# Patient Record
Sex: Female | Born: 1955 | Hispanic: No | Marital: Married | Smoking: Never smoker
Health system: Southern US, Community
[De-identification: ages and names within clinical notes are randomized; demographics above are authoritative.]

---

## 2002-04-22 ENCOUNTER — Other Ambulatory Visit: Admission: RE | Admit: 2002-04-22 | Discharge: 2002-04-22 | Payer: Self-pay | Admitting: Obstetrics and Gynecology

## 2004-05-21 ENCOUNTER — Other Ambulatory Visit: Admission: RE | Admit: 2004-05-21 | Discharge: 2004-05-21 | Payer: Self-pay | Admitting: Obstetrics and Gynecology

## 2005-06-01 ENCOUNTER — Other Ambulatory Visit: Admission: RE | Admit: 2005-06-01 | Discharge: 2005-06-01 | Payer: Self-pay | Admitting: Obstetrics and Gynecology

## 2009-02-06 ENCOUNTER — Ambulatory Visit: Payer: Self-pay | Admitting: Diagnostic Radiology

## 2009-02-06 ENCOUNTER — Ambulatory Visit (HOSPITAL_BASED_OUTPATIENT_CLINIC_OR_DEPARTMENT_OTHER): Admission: RE | Admit: 2009-02-06 | Discharge: 2009-02-06 | Payer: Self-pay | Admitting: Family Medicine

## 2011-02-09 ENCOUNTER — Other Ambulatory Visit: Payer: Self-pay | Admitting: Obstetrics and Gynecology

## 2011-02-09 DIAGNOSIS — R928 Other abnormal and inconclusive findings on diagnostic imaging of breast: Secondary | ICD-10-CM

## 2011-02-24 ENCOUNTER — Ambulatory Visit
Admission: RE | Admit: 2011-02-24 | Discharge: 2011-02-24 | Disposition: A | Discharge status: Home / Self Care | Payer: No Typology Code available for payment source | Source: Ambulatory Visit | Attending: Obstetrics and Gynecology | Admitting: Obstetrics and Gynecology

## 2011-02-24 DIAGNOSIS — R928 Other abnormal and inconclusive findings on diagnostic imaging of breast: Secondary | ICD-10-CM

## 2011-10-05 ENCOUNTER — Other Ambulatory Visit: Payer: Self-pay | Admitting: Obstetrics and Gynecology

## 2011-10-05 DIAGNOSIS — Z780 Asymptomatic menopausal state: Secondary | ICD-10-CM

## 2011-10-18 ENCOUNTER — Encounter: Payer: Self-pay | Admitting: Gastroenterology

## 2011-11-04 ENCOUNTER — Encounter: Payer: Self-pay | Admitting: Internal Medicine

## 2011-11-24 ENCOUNTER — Encounter: Payer: No Typology Code available for payment source | Admitting: Gastroenterology

## 2011-12-09 ENCOUNTER — Ambulatory Visit (AMBULATORY_SURGERY_CENTER): Payer: No Typology Code available for payment source | Admitting: *Deleted

## 2011-12-09 VITALS — Ht 60.0 in | Wt 123.0 lb

## 2011-12-09 DIAGNOSIS — Z1211 Encounter for screening for malignant neoplasm of colon: Secondary | ICD-10-CM

## 2011-12-09 MED ORDER — NA SULFATE-K SULFATE-MG SULF 17.5-3.13-1.6 GM/177ML PO SOLN: ORAL | Status: DC

## 2011-12-23 ENCOUNTER — Ambulatory Visit (AMBULATORY_SURGERY_CENTER): Payer: No Typology Code available for payment source | Admitting: Gastroenterology

## 2011-12-23 VITALS — BP 126/62 | HR 53 | Temp 96.9°F | Resp 12 | Ht 60.0 in | Wt 123.0 lb

## 2011-12-23 DIAGNOSIS — Z1211 Encounter for screening for malignant neoplasm of colon: Secondary | ICD-10-CM

## 2011-12-23 DIAGNOSIS — K648 Other hemorrhoids: Secondary | ICD-10-CM

## 2011-12-23 MED ORDER — SODIUM CHLORIDE 0.9 % IV SOLN: 500.0000 mL | INTRAVENOUS | Status: DC

## 2011-12-23 NOTE — Patient Instructions (Addendum)
Discharge instructions given with verbal understanding. Handouts on hemorrhoids and a high fiber diet given. Resume previous medications. YOU HAD AN ENDOSCOPIC PROCEDURE TODAY AT THE [facility]: Refer to the procedure report that was given to you for any specific questions about what was found during the examination.  If the procedure report does not answer your questions, please call your gastroenterologist to clarify.  If you requested that your care partner not be given the details of your procedure findings, then the procedure report has been included in a sealed envelope for you to review at your convenience later.  YOU SHOULD EXPECT: Some feelings of bloating in the abdomen. Passage of more gas than usual.  Walking can help get rid of the air that was put into your GI tract during the procedure and reduce the bloating. If you had a lower endoscopy (such as a colonoscopy or flexible sigmoidoscopy) you may notice spotting of blood in your stool or on the toilet paper. If you underwent a bowel prep for your procedure, then you may not have a normal bowel movement for a few days.  DIET: Your first meal following the procedure should be a light meal and then it is ok to progress to your normal diet.  A half-sandwich or bowl of soup is an example of a good first meal.  Heavy or fried foods are harder to digest and may make you feel nauseous or bloated.  Likewise meals heavy in dairy and vegetables can cause extra gas to form and this can also increase the bloating.  Drink plenty of fluids but you should avoid alcoholic beverages for 24 hours.  ACTIVITY: Your care partner should take you home directly after the procedure.  You should plan to take it easy, moving slowly for the rest of the day.  You can resume normal activity the day after the procedure however you should NOT DRIVE or use heavy machinery for 24 hours (because of the sedation medicines used during the test).    SYMPTOMS TO  REPORT IMMEDIATELY: A gastroenterologist can be reached at any hour.  During normal business hours, 8:30 AM to 5:00 PM Monday through Friday, call [phone].  After hours and on weekends, please call the GI answering service at [phone] who will take a message and have the physician on call contact you.   Following lower endoscopy (colonoscopy or flexible sigmoidoscopy):  Excessive amounts of blood in the stool  Significant tenderness or worsening of abdominal pains  Swelling of the abdomen that is new, acute  Fever of 100F or higher FOLLOW UP: If any biopsies were taken you will be contacted by phone or by letter within the next 1-3 weeks.  Call your gastroenterologist if you have not heard about the biopsies in 3 weeks.  Our staff will call the home number listed on your records the next business day following your procedure to check on you and address any questions or concerns that you may have at that time regarding the information given to you following your procedure. This is a courtesy call and so if there is no answer at the home number and we have not heard from you through the emergency physician on call, we will assume that you have returned to your regular daily activities without incident.  SIGNATURES/CONFIDENTIALITY: You and/or your care partner have signed paperwork which will be entered into your electronic medical record.  These signatures attest to the fact that that the information above on your   After Visit Summary has been reviewed and is understood.  Full responsibility of the confidentiality of this discharge information lies with you and/or your care-partner.  

## 2011-12-23 NOTE — Op Note (Addendum)
[facility] [address] [place], [address]   COLONOSCOPY PROCEDURE REPORT  PATIENT: Vanessa Chan, Vanessa Chan  MR#: [id number] BIRTHDATE: Jan 27, 1956 , 56  yrs. old GENDER: Female ENDOSCOPIST: Louis Meckel, MD REFERRED XB:JYNWGN Foy [place], M.D. PROCEDURE DATE:  12/23/2011 PROCEDURE:   Colonoscopy, diagnostic ASA CLASS:   Class I INDICATIONS:average risk screening. MEDICATIONS: MAC sedation, administered by CRNA, Propofol (Diprivan) 130 mg IV, and Atropine 1 mg IV  DESCRIPTION OF PROCEDURE:   After the risks benefits and alternatives of the procedure were thoroughly explained, informed consent was obtained.  A digital rectal exam revealed a skin tag. The LB CF-[id number]  endoscope was introduced through the anus and advanced to the cecum, which was identified by both the appendix and ileocecal valve. No adverse events experienced.   The quality of the prep was Suprep excellent  The instrument was then slowly withdrawn as the colon was fully examined.      COLON FINDINGS: Small internal hemorrhoids were found.   The colon mucosa was otherwise normal.  Retroflexed views revealed no abnormalities. The time to cecum=6 minutes 08 seconds.  Withdrawal time=6 minutes 09 seconds.  The scope was withdrawn and the procedure completed. COMPLICATIONS: There were no complications.  ENDOSCOPIC IMPRESSION: 1.   Small internal hemorrhoids 2.   The colon mucosa was otherwise normal  RECOMMENDATIONS: Continue current colorectal screening recommendations for "routine risk" patients with a repeat colonoscopy in 10 years.   eSigned:  Louis Meckel, MD 12/23/2011 9:54 AM Revised: 12/23/2011 9:54 AM  cc: Conley Simmonds, MD

## 2011-12-23 NOTE — Progress Notes (Signed)
Patient did not experience any of the following events: a burn prior to discharge; a fall within the facility; wrong site/side/patient/procedure/implant event; or a hospital transfer or hospital admission upon discharge from the facility. (G8907) Patient did not have preoperative order for IV antibiotic SSI prophylaxis. (G8918)  

## 2011-12-27 ENCOUNTER — Telehealth: Payer: Self-pay

## 2011-12-27 NOTE — Telephone Encounter (Signed)
Unable to leave message number was a fax machine.

## 2014-05-14 ENCOUNTER — Other Ambulatory Visit: Payer: Self-pay | Admitting: Obstetrics and Gynecology

## 2014-05-14 DIAGNOSIS — Z780 Asymptomatic menopausal state: Secondary | ICD-10-CM

## 2014-05-26 ENCOUNTER — Other Ambulatory Visit: Payer: No Typology Code available for payment source

## 2014-05-28 ENCOUNTER — Ambulatory Visit
Admission: RE | Admit: 2014-05-28 | Discharge: 2014-05-28 | Disposition: A | Discharge status: Home / Self Care | Payer: Commercial Managed Care - PPO | Source: Ambulatory Visit | Attending: Obstetrics and Gynecology | Admitting: Obstetrics and Gynecology

## 2014-05-28 DIAGNOSIS — Z780 Asymptomatic menopausal state: Secondary | ICD-10-CM

## 2016-06-09 DIAGNOSIS — K120 Recurrent oral aphthae: Secondary | ICD-10-CM | POA: Diagnosis not present

## 2016-07-06 DIAGNOSIS — Z124 Encounter for screening for malignant neoplasm of cervix: Secondary | ICD-10-CM | POA: Diagnosis not present

## 2016-07-06 DIAGNOSIS — Z1231 Encounter for screening mammogram for malignant neoplasm of breast: Secondary | ICD-10-CM | POA: Diagnosis not present

## 2016-07-06 DIAGNOSIS — Z01419 Encounter for gynecological examination (general) (routine) without abnormal findings: Secondary | ICD-10-CM | POA: Diagnosis not present

## 2016-08-15 DIAGNOSIS — H04123 Dry eye syndrome of bilateral lacrimal glands: Secondary | ICD-10-CM | POA: Diagnosis not present

## 2016-08-15 DIAGNOSIS — H35372 Puckering of macula, left eye: Secondary | ICD-10-CM | POA: Diagnosis not present

## 2016-08-15 DIAGNOSIS — H2513 Age-related nuclear cataract, bilateral: Secondary | ICD-10-CM | POA: Diagnosis not present

## 2017-02-17 DIAGNOSIS — H35372 Puckering of macula, left eye: Secondary | ICD-10-CM | POA: Diagnosis not present

## 2017-02-24 DIAGNOSIS — Z23 Encounter for immunization: Secondary | ICD-10-CM | POA: Diagnosis not present

## 2017-07-12 DIAGNOSIS — Z01419 Encounter for gynecological examination (general) (routine) without abnormal findings: Secondary | ICD-10-CM | POA: Diagnosis not present

## 2017-07-12 DIAGNOSIS — Z124 Encounter for screening for malignant neoplasm of cervix: Secondary | ICD-10-CM | POA: Diagnosis not present

## 2017-07-12 DIAGNOSIS — Z1231 Encounter for screening mammogram for malignant neoplasm of breast: Secondary | ICD-10-CM | POA: Diagnosis not present

## 2021-02-09 ENCOUNTER — Other Ambulatory Visit: Payer: Self-pay | Admitting: Obstetrics

## 2021-02-09 DIAGNOSIS — R928 Other abnormal and inconclusive findings on diagnostic imaging of breast: Secondary | ICD-10-CM

## 2021-02-26 ENCOUNTER — Ambulatory Visit: Payer: Commercial Managed Care - PPO

## 2021-02-26 ENCOUNTER — Ambulatory Visit
Admission: RE | Admit: 2021-02-26 | Discharge: 2021-02-26 | Disposition: A | Discharge status: Home / Self Care | Payer: Managed Care, Other (non HMO) | Source: Ambulatory Visit | Attending: Obstetrics | Admitting: Obstetrics

## 2021-02-26 ENCOUNTER — Other Ambulatory Visit: Payer: Self-pay

## 2021-02-26 DIAGNOSIS — R928 Other abnormal and inconclusive findings on diagnostic imaging of breast: Secondary | ICD-10-CM

## 2021-09-14 ENCOUNTER — Other Ambulatory Visit: Payer: Self-pay | Admitting: Family Medicine

## 2021-09-14 DIAGNOSIS — M8580 Other specified disorders of bone density and structure, unspecified site: Secondary | ICD-10-CM

## 2022-02-10 ENCOUNTER — Encounter: Payer: Self-pay | Admitting: Internal Medicine

## 2022-02-23 ENCOUNTER — Encounter: Payer: Self-pay | Admitting: Internal Medicine

## 2022-03-11 ENCOUNTER — Ambulatory Visit (AMBULATORY_SURGERY_CENTER): Payer: Self-pay

## 2022-03-11 VITALS — Ht 60.0 in | Wt 120.0 lb

## 2022-03-11 DIAGNOSIS — Z1211 Encounter for screening for malignant neoplasm of colon: Secondary | ICD-10-CM

## 2022-03-11 MED ORDER — NA SULFATE-K SULFATE-MG SULF 17.5-3.13-1.6 GM/177ML PO SOLN: 1.0000 | Freq: Once | ORAL | 0 refills | Status: AC

## 2022-03-11 NOTE — Progress Notes (Signed)
No egg or soy allergy known to patient  No issues known to pt with past sedation with any surgeries or procedures Patient denies ever being told they had issues or difficulty with intubation  No FH of Malignant Hyperthermia Pt is not on diet pills Pt is not on  home 02  Pt is not on blood thinners  Pt denies issues with constipation  No A fib or A flutter Have any cardiac testing pending--no Pt instructed to use [employer] or GoodRx for a price reduction on prep   Due to language barrier, an interpreter was present during the history-taking and subsequent discussion (and for part of the physical exam) with this patient. Family member was interpreter and Release was signed.  Patient's chart reviewed by Cathlyn Parsons CNRA prior to previsit and patient appropriate for the [facility].  Previsit completed and red dot placed by patient's name on their procedure day (on provider's schedule).

## 2022-04-05 ENCOUNTER — Encounter: Payer: Self-pay | Admitting: Internal Medicine

## 2022-04-11 ENCOUNTER — Ambulatory Visit: Payer: Managed Care, Other (non HMO) | Admitting: Internal Medicine

## 2022-04-11 ENCOUNTER — Encounter: Payer: Self-pay | Admitting: Internal Medicine

## 2022-04-11 VITALS — BP 122/55 | HR 51 | Temp 97.5°F | Resp 16 | Ht 60.0 in | Wt 111.6 lb

## 2022-04-11 DIAGNOSIS — Z1211 Encounter for screening for malignant neoplasm of colon: Secondary | ICD-10-CM | POA: Diagnosis not present

## 2022-04-11 MED ORDER — SODIUM CHLORIDE 0.9 % IV SOLN: 500.0000 mL | Freq: Once | INTRAVENOUS | Status: DC

## 2022-04-11 NOTE — Patient Instructions (Signed)
-  Discharge patient to home (with escort). - Repeat colonoscopy in 10 years for screening purposes. -Handout on hemorrhoids provided   YOU HAD AN ENDOSCOPIC PROCEDURE TODAY AT THE [facility]:   Refer to the procedure report that was given to you for any specific questions about what was found during the examination.  If the procedure report does not answer your questions, please call your gastroenterologist to clarify.  If you requested that your care partner not be given the details of your procedure findings, then the procedure report has been included in a sealed envelope for you to review at your convenience later.  YOU SHOULD EXPECT: Some feelings of bloating in the abdomen. Passage of more gas than usual.  Walking can help get rid of the air that was put into your GI tract during the procedure and reduce the bloating. If you had a lower endoscopy (such as a colonoscopy or flexible sigmoidoscopy) you may notice spotting of blood in your stool or on the toilet paper. If you underwent a bowel prep for your procedure, you may not have a normal bowel movement for a few days.  Please Note:  You might notice some irritation and congestion in your nose or some drainage.  This is from the oxygen used during your procedure.  There is no need for concern and it should clear up in a day or so.  SYMPTOMS TO REPORT IMMEDIATELY:  Following lower endoscopy (colonoscopy or flexible sigmoidoscopy):  Excessive amounts of blood in the stool  Significant tenderness or worsening of abdominal pains  Swelling of the abdomen that is new, acute  Fever of 100F or higher  For urgent or emergent issues, a gastroenterologist can be reached at any hour by calling [phone]. Do not use MyChart messaging for urgent concerns.    DIET:  We do recommend a small meal at first, but then you may proceed to your regular diet.  Drink plenty of fluids but you should avoid alcoholic beverages for 24  hours.  ACTIVITY:  You should plan to take it easy for the rest of today and you should NOT DRIVE or use heavy machinery until tomorrow (because of the sedation medicines used during the test).    FOLLOW UP: Our staff will call the number listed on your records the next business day following your procedure.  We will call around 7:15- 8:00 am to check on you and address any questions or concerns that you may have regarding the information given to you following your procedure. If we do not reach you, we will leave a message.     If any biopsies were taken you will be contacted by phone or by letter within the next 1-3 weeks.  Please call us at [phone] if you have not heard about the biopsies in 3 weeks.    SIGNATURES/CONFIDENTIALITY: You and/or your care partner have signed paperwork which will be entered into your electronic medical record.  These signatures attest to the fact that that the information above on your After Visit Summary has been reviewed and is understood.  Full responsibility of the confidentiality of this discharge information lies with you and/or your care-partner.

## 2022-04-11 NOTE — Progress Notes (Signed)
Vss nad trans to pacu °

## 2022-04-11 NOTE — Progress Notes (Signed)
GASTROENTEROLOGY PROCEDURE H&P NOTE   Primary Care Physician: Marinda Elk, MD    Reason for Procedure:   Colon cancer screening  Plan:    Colonoscopy  Patient is appropriate for endoscopic procedure(s) in the ambulatory ([facility]) setting.  The nature of the procedure, as well as the risks, benefits, and alternatives were carefully and thoroughly reviewed with the patient. Ample time for discussion and questions allowed. The patient understood, was satisfied, and agreed to proceed.     HPI: Vanessa Chan is a 66 y.o. female who presents for colonoscopy for colon cancer screening. Denies blood in stools or unintentional weight loss. Denies family history of colon cancer. Last colonoscopy was 10 years ago that was normal.  History reviewed. No pertinent past medical history.  Past Surgical History:  Procedure Laterality Date   CESAREAN SECTION  1987/1997    Prior to Admission medications   Medication Sig Start Date End Date Taking? Authorizing Provider  Astaxanthin 5 MG CAPS Take by mouth.   Yes [provider]  cholecalciferol (VITAMIN D3) 25 MCG (1000 UNIT) tablet Take 1,000 Units by mouth daily.   Yes [provider]  fish oil-omega-3 fatty acids 1000 MG capsule Take 1 g by mouth daily.   Yes [provider]    Current Outpatient Medications  Medication Sig Dispense Refill   Astaxanthin 5 MG CAPS Take by mouth.     cholecalciferol (VITAMIN D3) 25 MCG (1000 UNIT) tablet Take 1,000 Units by mouth daily.     fish oil-omega-3 fatty acids 1000 MG capsule Take 1 g by mouth daily.     Current Facility-Administered Medications  Medication Dose Route Frequency Provider Last Rate Last Admin   0.9 %  sodium chloride infusion  500 mL Intravenous Once Imogene Burn, MD        Allergies as of 04/11/2022   (No Known Allergies)    Family History  Problem Relation Age of Onset   Breast cancer Sister    Colon cancer Neg Hx    Colon polyps Neg Hx     Esophageal cancer Neg Hx    Rectal cancer Neg Hx    Stomach cancer Neg Hx     Social History   Socioeconomic History   Marital status: Married    Spouse name: Not on file   Number of children: Not on file   Years of education: Not on file   Highest education level: Not on file  Occupational History   Not on file  Tobacco Use   Smoking status: Never   Smokeless tobacco: Never  Vaping Use   Vaping Use: Never used  Substance and Sexual Activity   Alcohol use: No   Drug use: No   Sexual activity: Not on file  Other Topics Concern   Not on file  Social History Narrative   Not on file   Social Determinants of Health   Financial Resource Strain: Not on file  Food Insecurity: Not on file  Transportation Needs: Not on file  Physical Activity: Not on file  Stress: Not on file  Social Connections: Not on file  Intimate Partner Violence: Not on file    Physical Exam: Vital signs in last 24 hours: BP 109/73   Pulse 60   Temp (!) 97.5 F (36.4 C)   Ht 5' (1.524 m)   Wt 111 lb 9.6 oz (50.6 kg)   SpO2 98%   BMI 21.80 kg/m  GEN: NAD EYE: Sclerae anicteric ENT: MMM  CV: Non-tachycardic Pulm: No increased work of breathing GI: Soft, NT/ND NEURO:  Alert & Oriented   Vanessa Pont, MD [facility]  04/11/2022 10:51 AM

## 2022-04-11 NOTE — Op Note (Addendum)
[facility] Patient Name: Vanessa Chan Procedure Date: 04/11/2022 11:22 AM MRN: [id number] Endoscopist: Georgian Co , , [id number] Age: 66 Referring MD:  Date of Birth: Sep 22, 1955 Gender: Female Account #: [id number] Procedure:                Colonoscopy Indications:              Screening for colorectal malignant neoplasm Medicines:                Monitored Anesthesia Care Procedure:                Pre-Anesthesia Assessment:                           - Prior to the procedure, a History and Physical                            was performed, and patient medications and                            allergies were reviewed. The patient's tolerance of                            previous anesthesia was also reviewed. The risks                            and benefits of the procedure and the sedation                            options and risks were discussed with the patient.                            All questions were answered, and informed consent                            was obtained. Prior Anticoagulants: The patient has                            taken no anticoagulant or antiplatelet agents. ASA                            Grade Assessment: I - A normal, healthy patient.                            After reviewing the risks and benefits, the patient                            was deemed in satisfactory condition to undergo the                            procedure.                           After obtaining informed consent, the colonoscope  was passed under direct vision. Throughout the                            procedure, the patient's blood pressure, pulse, and                            oxygen saturations were monitored continuously. The                            Olympus [id number]-[id number] (#[id number]) Colonoscope was                            introduced through the anus and advanced to the the                            terminal ileum. The  colonoscopy was performed                            without difficulty. The patient tolerated the                            procedure well. The quality of the bowel                            preparation was good. The terminal ileum, ileocecal                            valve, appendiceal orifice, and rectum were                            photographed. Scope In: 11:34:24 AM Scope Out: 11:47:49 AM Scope Withdrawal Time: 0 hours 10 minutes 24 seconds  Total Procedure Duration: 0 hours 13 minutes 25 seconds  Findings:                 The terminal ileum appeared normal.                           Non-bleeding internal hemorrhoids were found during                            retroflexion.                           The exam was otherwise without abnormality. Complications:            No immediate complications. Estimated Blood Loss:     Estimated blood loss: none. Impression:               - The examined portion of the ileum was normal.                           - Non-bleeding internal hemorrhoids.                           - The examination was otherwise normal.                           -  No specimens collected. Recommendation:           - Discharge patient to home (with escort).                           - Repeat colonoscopy in 10 years for screening                            purposes.                           - The findings and recommendations were discussed                            with the patient. Dr Georgian Co "Lyndee Leo" Lorenso Courier,  04/11/2022 11:51:18 AM

## 2022-04-11 NOTE — Progress Notes (Signed)
VS by DT  Pt's states no medical or surgical changes since previsit or office visit.  

## 2022-04-12 ENCOUNTER — Telehealth: Payer: Self-pay | Admitting: *Deleted

## 2022-04-12 NOTE — Telephone Encounter (Signed)
No answer on  follow up call. Left message.   

## 2022-12-24 IMAGING — MG MM DIGITAL DIAGNOSTIC UNILAT*L* W/ TOMO W/ CAD
8 series · 9 of 24 positions shown · non-contrast
Comparison: Previous exam(s).

CLINICAL DATA: Patient recalled from screening for left breast
possible mass and distortion.

EXAM:
DIGITAL DIAGNOSTIC UNILATERAL LEFT MAMMOGRAM WITH TOMOSYNTHESIS AND
CAD
TECHNIQUE: Left digital diagnostic mammography and breast tomosynthesis was
performed. The images were evaluated with computer-aided detection.

[L ML synth-2D]
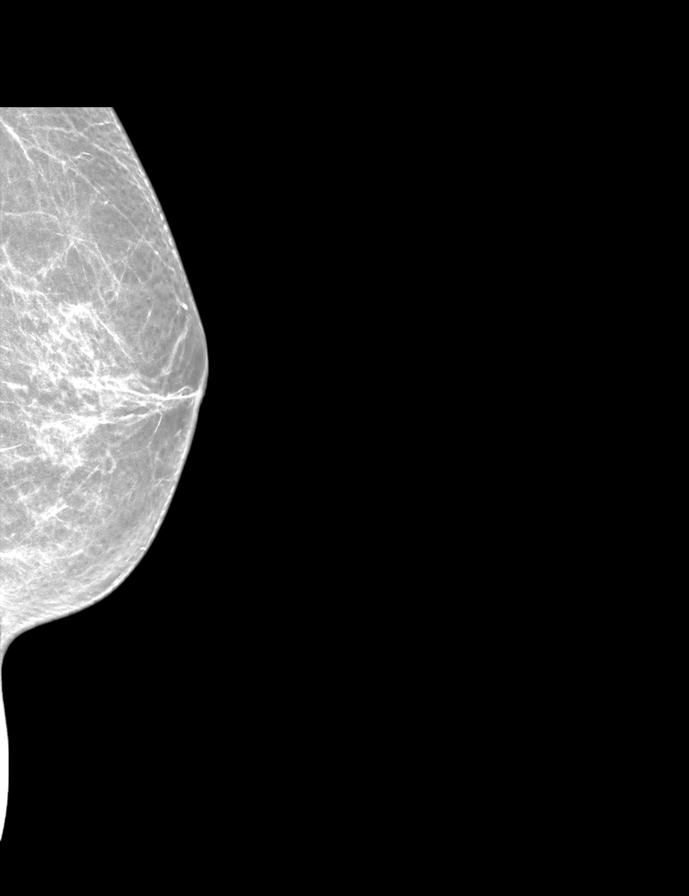

[L CC synth-2D (1 of 3)]
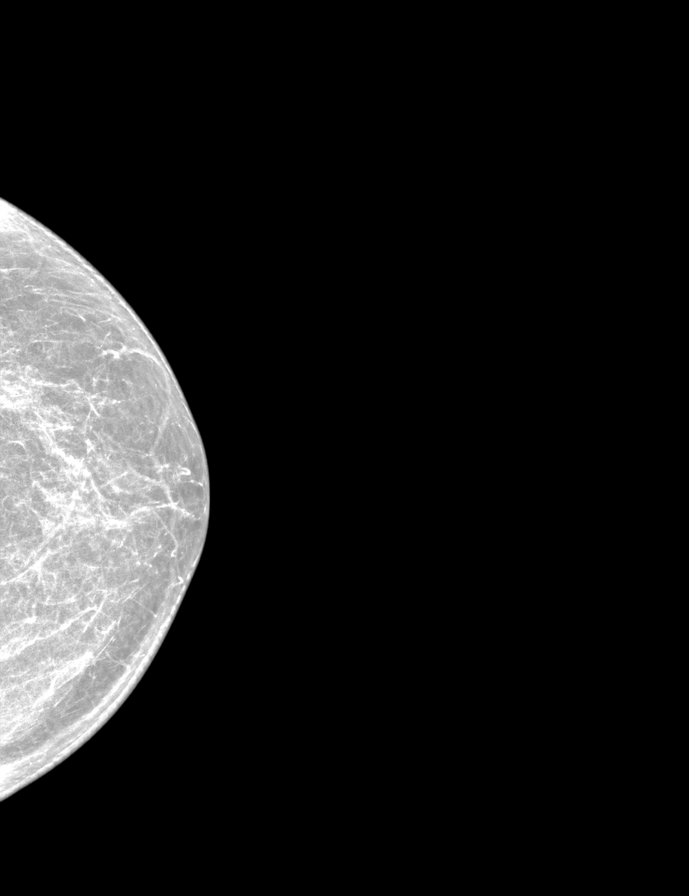

[L CC synth-2D (2 of 3)]
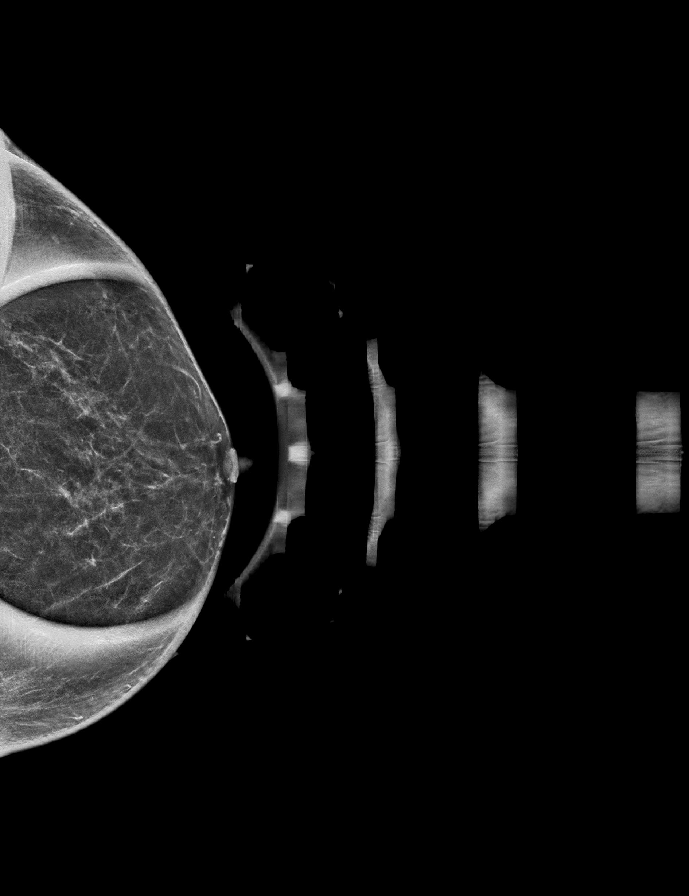

[L CC synth-2D (3 of 3)]
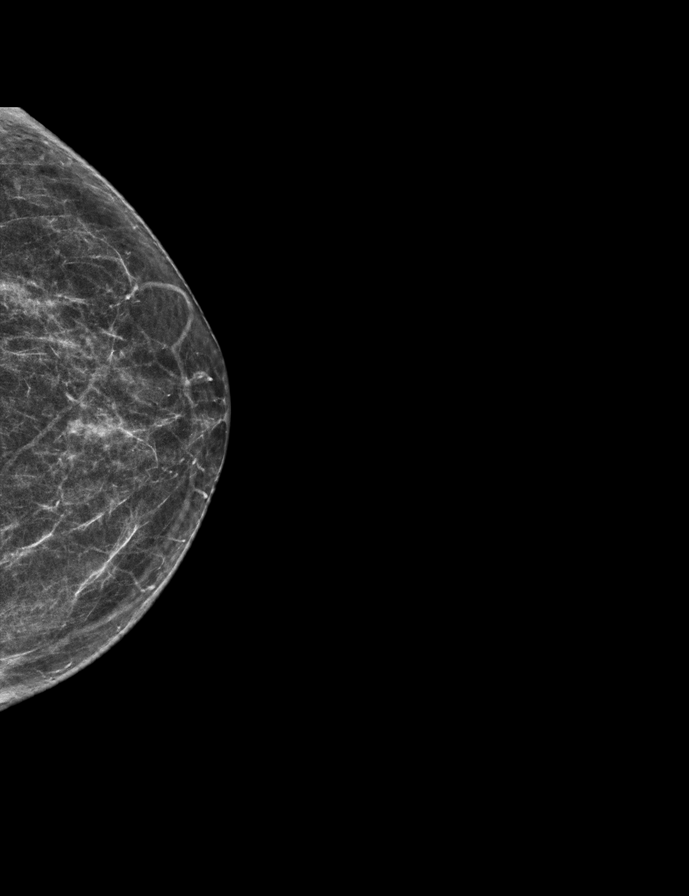

[L CC tomo · 2 of 54 frames shown (1 of 3)]
[frame 18/54]
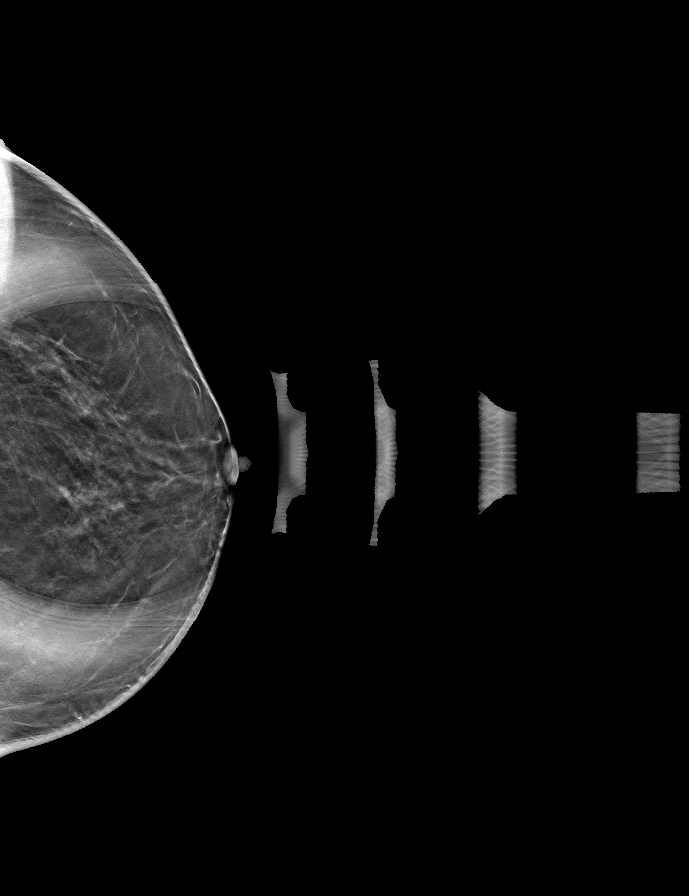
[frame 27/54]
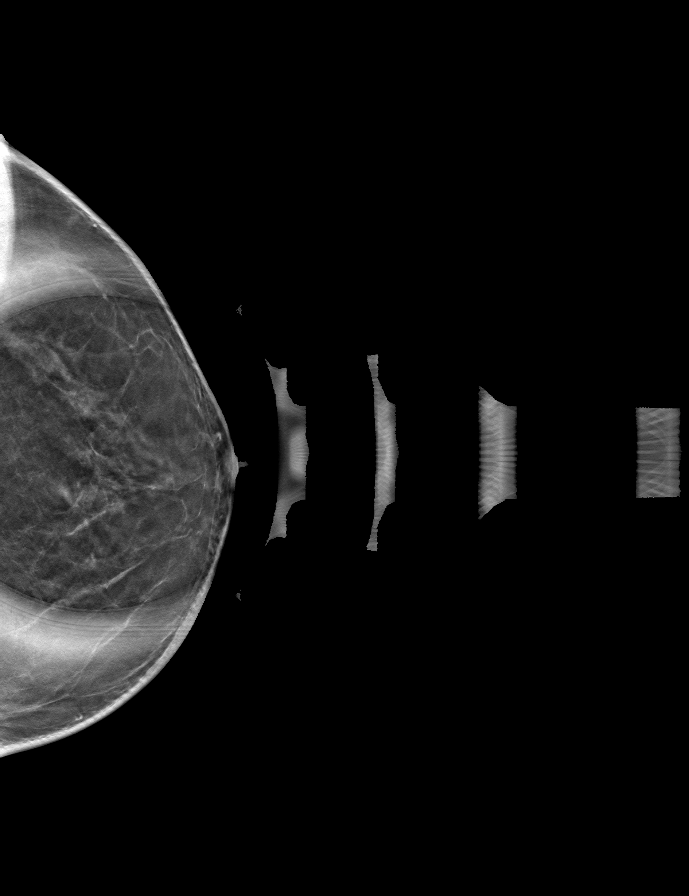

[L ML tomo · tomo slice 27/54.0]
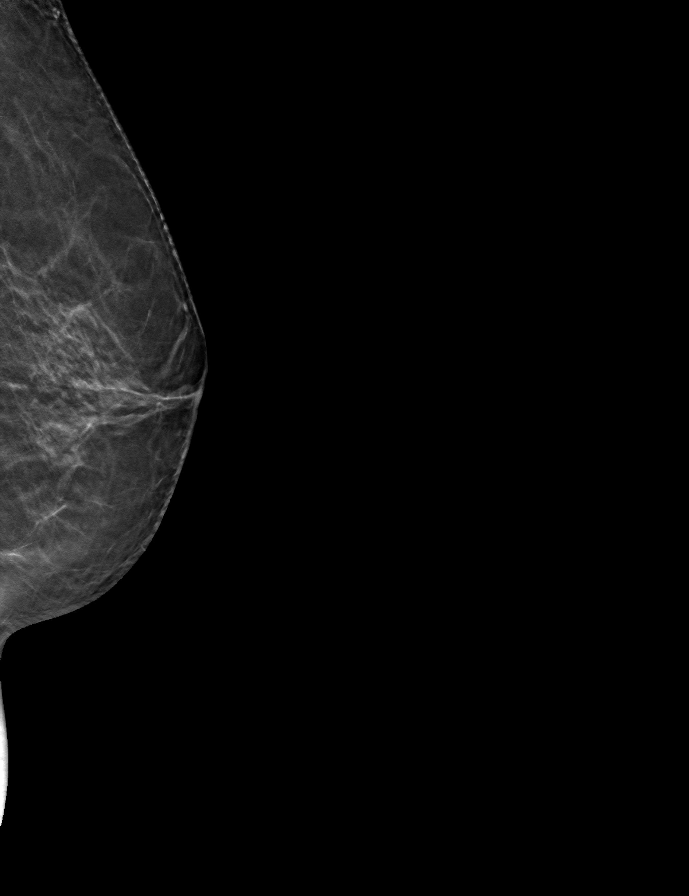

[L CC tomo (2 of 3) · tomo slice 28/55.0]
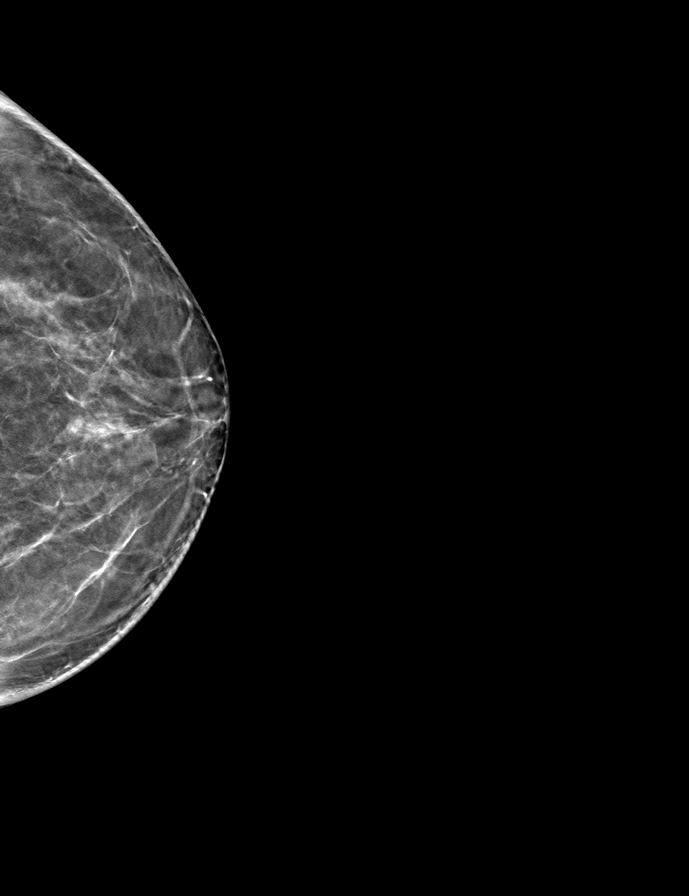

[L CC tomo (3 of 3) · tomo slice 27/52.0]
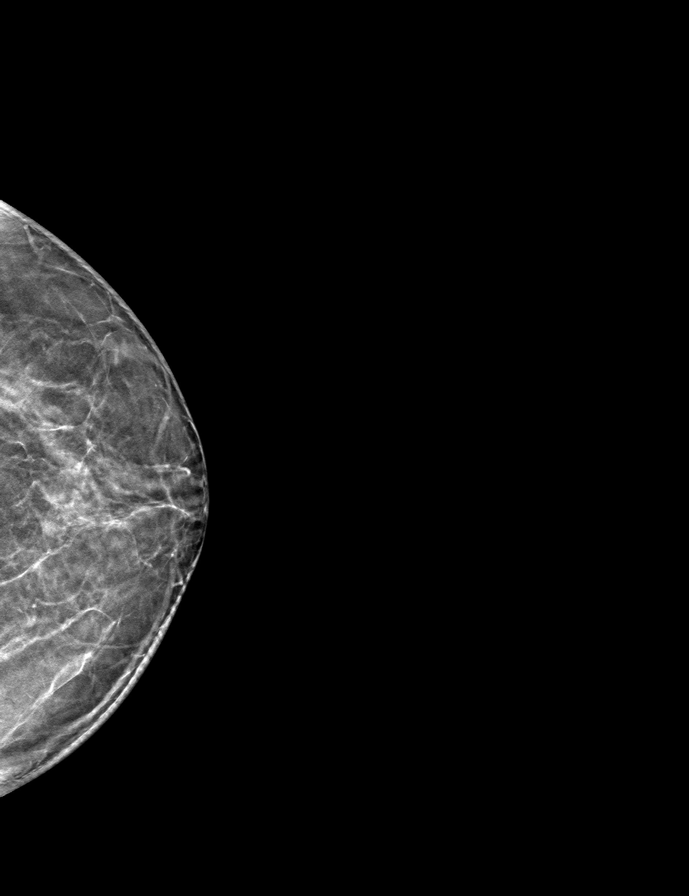

[9 of 24 positions shown; findings below may reference images not displayed]

ACR Breast Density Category c: The breast tissue is heterogeneously
dense, which may obscure small masses.
FINDINGS: Questioned mass and distortion within the central left breast
resolved with additional imaging compatible with dense overlapping
fibroglandular tissue. No suspicious findings on additional imaging.
IMPRESSION: No mammographic evidence for malignancy.

RECOMMENDATION:
Screening mammogram in one year.(Code:1G-3-SDL)

I have discussed the findings and recommendations with the patient.
If applicable, a reminder letter will be sent to the patient
regarding the next appointment.

BI-RADS CATEGORY  1: Negative.

## 2023-02-24 ENCOUNTER — Other Ambulatory Visit (HOSPITAL_COMMUNITY): Payer: Self-pay | Admitting: Family Medicine

## 2023-02-24 DIAGNOSIS — R0602 Shortness of breath: Secondary | ICD-10-CM

## 2023-04-17 ENCOUNTER — Ambulatory Visit (HOSPITAL_COMMUNITY)
Admission: RE | Admit: 2023-04-17 | Discharge: 2023-04-17 | Disposition: A | Discharge status: Home / Self Care | Payer: Managed Care, Other (non HMO) | Source: Ambulatory Visit | Attending: Family Medicine | Admitting: Family Medicine

## 2023-04-17 DIAGNOSIS — R0602 Shortness of breath: Secondary | ICD-10-CM | POA: Insufficient documentation

## 2023-04-17 LAB — ECHOCARDIOGRAM COMPLETE
Area-P 1/2: 2.45 cm2
S' Lateral: 2.4 cm

## 2023-11-01 ENCOUNTER — Other Ambulatory Visit (HOSPITAL_BASED_OUTPATIENT_CLINIC_OR_DEPARTMENT_OTHER): Payer: Self-pay | Admitting: Family Medicine

## 2023-11-01 DIAGNOSIS — M810 Age-related osteoporosis without current pathological fracture: Secondary | ICD-10-CM

## 2023-12-06 ENCOUNTER — Other Ambulatory Visit: Payer: Self-pay | Admitting: Obstetrics

## 2023-12-06 DIAGNOSIS — Z1231 Encounter for screening mammogram for malignant neoplasm of breast: Secondary | ICD-10-CM
